# Patient Record
Sex: Male | Born: 1995
Health system: Southern US, Community
[De-identification: ages and names within clinical notes are randomized; demographics above are authoritative.]

---
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
## 2016-01-24 ENCOUNTER — Encounter (HOSPITAL_COMMUNITY): Payer: Self-pay | Admitting: Emergency Medicine

## 2016-01-24 ENCOUNTER — Emergency Department (INDEPENDENT_AMBULATORY_CARE_PROVIDER_SITE_OTHER)
Admission: EM | Admit: 2016-01-24 | Discharge: 2016-01-24 | Disposition: A | Discharge status: Home / Self Care | Payer: 59 | Source: Home / Self Care | Attending: Family Medicine | Admitting: Family Medicine

## 2016-01-24 DIAGNOSIS — J111 Influenza due to unidentified influenza virus with other respiratory manifestations: Secondary | ICD-10-CM

## 2016-01-24 MED ORDER — OSELTAMIVIR PHOSPHATE 75 MG PO CAPS: 75.0000 mg | ORAL_CAPSULE | Freq: Two times a day (BID) | ORAL | Status: AC

## 2016-01-24 NOTE — Discharge Instructions (Signed)
Influenza, Adult Influenza (flu) is an infection in the mouth, nose, and throat (respiratory tract) caused by a virus. The flu can make you feel very ill. Influenza spreads easily from person to person (contagious).  HOME CARE   Only take medicines as told by your doctor.  Use a cool mist humidifier to make breathing easier.  Get plenty of rest until your fever goes away. This usually takes 3 to 4 days.  Drink enough fluids to keep your pee (urine) clear or pale yellow.  Cover your mouth and nose when you cough or sneeze.  Wash your hands well to avoid spreading the flu.  Stay home from work or school until your fever has been gone for at least 1 full day.  Get a flu shot every year. GET HELP RIGHT AWAY IF:   You have trouble breathing or feel short of breath.  Your skin or nails turn blue.  You have severe neck pain or stiffness.  You have a severe headache, facial pain, or earache.  Your fever gets worse or keeps coming back.  You feel sick to your stomach (nauseous), throw up (vomit), or have watery poop (diarrhea).  You have chest pain.  You have a deep cough that gets worse, or you cough up more thick spit (mucus). MAKE SURE YOU:   Understand these instructions.  Will watch your condition.  Will get help right away if you are not doing well or get worse.   This information is not intended to replace advice given to you by your health care provider. Make sure you discuss any questions you have with your health care provider.   Document Released: 08/20/2008 Document Revised: 12/02/2014 Document Reviewed: 02/10/2012 Elsevier Interactive Patient Education 2016 [employer]  

## 2016-01-24 NOTE — ED Notes (Signed)
FLU-LIKE SYMPTOMS, ONSET Monday EVENING

## 2016-01-25 NOTE — ED Provider Notes (Signed)
CSN: [id number]     Arrival date & time 01/24/16  1408 History   First MD Initiated Contact with Patient 01/24/16 1533     Chief Complaint  Patient presents with  . Influenza   (Consider location/radiation/quality/duration/timing/severity/associated sxs/prior Treatment) HPI Pt presents with sore throat, body aches, fever, chills since last night Home treatment has been OTC meds without much relief of symptoms Fever is improved for short periods of time with OTC antipyretics. Pain score is 4 mostly from coughing and body aches Taking fluids, no appetite No flu shot Has been exposed to others with similar symptoms.  Denies: CP, SOB, vomiting or diarrhea.  History reviewed. No pertinent past medical history. History reviewed. No pertinent past surgical history. Family History  Problem Relation Age of Onset  . Hypertension Mother   . Hypertension Father   . Hyperlipidemia Father   . Diabetes Father    Social History  Substance Use Topics  . Smoking status: Never Smoker   . Smokeless tobacco: Never Used  . Alcohol Use: No    Review of Systems See HPI Allergies  Review of patient's allergies indicates no known allergies.  Home Medications   Prior to Admission medications   Medication Sig Start Date End Date Taking? Authorizing Provider  clindamycin (CLEOCIN) 150 MG capsule Take 1 capsule (150 mg total) by mouth 4 (four) times daily. 11/27/15   Linna Hoff, MD  clindamycin (CLEOCIN) 150 MG capsule Take 1 capsule (150 mg total) by mouth 4 (four) times daily. 11/27/15   Linna Hoff, MD  diphenhydrAMINE (BENADRYL) 12.5 MG/5ML elixir Take 12.5 mg by mouth every 4 (four) hours as needed for allergies. For allergic reaction    Historical Provider, MD  ibuprofen (ADVIL,MOTRIN) 600 MG tablet Take 300-600 mg by mouth every 6 (six) hours as needed for pain. For headache 08/22/12   Johnsie Kindred, NP  loratadine (CLARITIN) 10 MG tablet Take 10 mg by mouth daily.    Historical Provider,  MD  oseltamivir (TAMIFLU) 75 MG capsule Take 1 capsule (75 mg total) by mouth every 12 (twelve) hours. 01/24/16   Tharon Aquas, PA   Meds Ordered and Administered this Visit  Medications - No data to display  BP 126/84 mmHg  Pulse 92  Temp(Src) 99.4 F (37.4 C) (Oral)  Resp 18  SpO2 100% No data found.   Physical Exam NURSES NOTES AND VITAL SIGNS REVIEWED. CONSTITUTIONAL: Well developed, well nourished, no acute distress HEENT: normocephalic, atraumatic, right and left TM's are normal EYES: Conjunctiva normal NECK:normal ROM, supple, no adenopathy PULMONARY:No respiratory distress, normal effort, Lungs: CTAb/l, no wheezes, or increased work of breathing CARDIOVASCULAR: RRR, no murmur ABDOMEN: soft, ND, NT, +'ve BS MUSCULOSKELETAL: Normal ROM of all extremities,  SKIN: warm and dry without rash PSYCHIATRIC: Mood and affect, behavior are normal  ED Course  Procedures (including critical care time)  Labs Review Labs Reviewed - No data to display  Imaging Review No results found.   Visual Acuity Review  Right Eye Distance:   Left Eye Distance:   Bilateral Distance:    Right Eye Near:   Left Eye Near:    Bilateral Near:      RX provided for symptoms per request of father   MDM   1. Flu    Patient is reassured that there are no issues that require transfer to higher level of care at this time.  Patient is advised to continue home symptomatic treatment. Prescription is sent to  pharmacy  patient has indicated.  tamiflu Patient is advised that if there are new or worsening symptoms or attend the emergency department, or contact primary care provider. Instructions of care provided discharged home in stable condition. Return to work/school note provided.  THIS NOTE WAS GENERATED USING A VOICE RECOGNITION SOFTWARE PROGRAM. ALL REASONABLE EFFORTS  WERE MADE TO PROOFREAD THIS DOCUMENT FOR ACCURACY.     Tharon Aquas, [place] 01/25/16 [phone]

## 2016-11-01 ENCOUNTER — Encounter (HOSPITAL_COMMUNITY): Payer: Self-pay | Admitting: *Deleted

## 2016-11-01 ENCOUNTER — Ambulatory Visit (HOSPITAL_COMMUNITY)
Admission: EM | Admit: 2016-11-01 | Discharge: 2016-11-01 | Disposition: A | Discharge status: Home / Self Care | Payer: 59 | Attending: Family Medicine | Admitting: Family Medicine

## 2016-11-01 DIAGNOSIS — J029 Acute pharyngitis, unspecified: Secondary | ICD-10-CM | POA: Insufficient documentation

## 2016-11-01 LAB — POCT RAPID STREP A: STREPTOCOCCUS, GROUP A SCREEN (DIRECT): NEGATIVE

## 2016-11-01 MED ORDER — CLINDAMYCIN HCL 150 MG PO CAPS: 150.0000 mg | ORAL_CAPSULE | Freq: Four times a day (QID) | ORAL | 0 refills | Status: AC

## 2016-11-01 MED FILL — CLINDAMYCIN HCL 150 MG CAPS: 150 | 7 days supply | Qty: 28 | Fill #0

## 2016-11-01 NOTE — ED Provider Notes (Signed)
CSN: [id number]     Arrival date & time 11/01/16  1004 History   None    Chief Complaint  Patient presents with  . Sore Throat    The history is provided by the patient.  Sore Throat  This is a recurrent problem. The current episode started more than 2 days ago. The problem occurs constantly. The problem has been gradually worsening. Pertinent negatives include no chest pain, no abdominal pain, no headaches and no shortness of breath. The symptoms are aggravated by swallowing. Nothing relieves the symptoms.  Symptoms associated w/ fevers of 103 (last night). No cough or other upper respiratory symptoms. Admits to increased PND. Symptoms not relieved by salt water gargles or Ibuprofen.   History reviewed. No pertinent past medical history. History reviewed. No pertinent surgical history. Family History  Problem Relation Age of Onset  . Hypertension Mother   . Hypertension Father   . Hyperlipidemia Father   . Diabetes Father    Social History  Substance Use Topics  . Smoking status: Never Smoker  . Smokeless tobacco: Never Used  . Alcohol use No    Review of Systems  Respiratory: Negative for shortness of breath.   Cardiovascular: Negative for chest pain.  Gastrointestinal: Negative for abdominal pain.  Neurological: Negative for headaches.  All other systems reviewed and are negative.   Allergies  Patient has no known allergies.  Home Medications   Prior to Admission medications   Medication Sig Start Date End Date Taking? Authorizing Provider  clindamycin (CLEOCIN) 150 MG capsule Take 1 capsule (150 mg total) by mouth every 6 (six) hours. 11/01/16   Roma KayserKatherine P Kyrstin Campillo, NP  diphenhydrAMINE (BENADRYL) 12.5 MG/5ML elixir Take 12.5 mg by mouth every 4 (four) hours as needed for allergies. For allergic reaction    Historical Provider, MD  ibuprofen (ADVIL,MOTRIN) 600 MG tablet Take 300-600 mg by mouth every 6 (six) hours as needed for pain. For headache 08/22/12   Johnsie Kindredarmen L  Chatten, NP  loratadine (CLARITIN) 10 MG tablet Take 10 mg by mouth daily.    Historical Provider, MD  oseltamivir (TAMIFLU) 75 MG capsule Take 1 capsule (75 mg total) by mouth every 12 (twelve) hours. 01/24/16   Tharon AquasFrank C Patrick, PA   Meds Ordered and Administered this Visit  Medications - No data to display  BP 130/70 (BP Location: Right Arm)   Pulse 80   Temp 98.6 F (37 C) (Oral)   Resp 18   SpO2 100%  No data found.   Physical Exam  Constitutional: He is oriented to person, place, and time. He appears well-nourished.  HENT:  Head: Normocephalic and atraumatic.  Right Ear: Tympanic membrane normal.  Left Ear: Tympanic membrane normal.  Nose: Nose normal.  Mouth/Throat: Uvula is midline and mucous membranes are normal. Posterior oropharyngeal edema and posterior oropharyngeal erythema present. No oropharyngeal exudate or tonsillar abscesses. Tonsils are 2+ on the right. Tonsils are 2+ on the left. No tonsillar exudate.  Eyes: Conjunctivae and lids are normal.  Cardiovascular: Normal rate.   Pulmonary/Chest: Effort normal.  Neurological: He is alert and oriented to person, place, and time.  Skin: Skin is warm and dry.    Urgent Care Course   Clinical Course     Procedures (including critical care time)  Labs Review Labs Reviewed  POCT RAPID STREP A    Imaging Review No results found.   Visual Acuity Review  Right Eye Distance:   Left Eye Distance:   Bilateral Distance:  Right Eye Near:   Left Eye Near:    Bilateral Near:         MDM   1. 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  ? Allergies Clindamycin 150 mg QID x 7 days Claritin and Ibuprofen as directed.  Home care discussed and provided in print.    Leanne ChangKatherine P Shanisha Lech, NP 11/01/16 1137

## 2016-11-01 NOTE — Discharge Instructions (Signed)
Your strep test today is negative. We are treating your for acute pharyngitis. Take antibiotics until completed. You may also benefit from over the counter Claritin (Loratadine) 10 mg daily and Ibuprofen 400-600 mg 3 to 4 times daily as needed for fever, aches and pain.

## 2016-11-01 NOTE — ED Triage Notes (Signed)
Pt   Reports   Symptoms   Of     sorethroat     -       And     Pain   When  He   Swallows

## 2016-11-02 LAB — CULTURE, GROUP A STREP (THRC)

## 2020-03-16 ENCOUNTER — Ambulatory Visit: Payer: 59 | Attending: Family

## 2020-03-16 DIAGNOSIS — Z23 Encounter for immunization: Secondary | ICD-10-CM

## 2020-03-16 NOTE — Progress Notes (Signed)
   Covid-19 Vaccination Clinic  Name:  Dustin Preston    MRN: [id number] DOB: Jun 28, 1996  03/16/2020  [name] was observed post Covid-19 immunization for 15 minutes without incident. He was provided with Vaccine Information Sheet and instruction to access the V-Safe system.   [name] was instructed to call 911 with any severe reactions post vaccine: Marland Kitchen Difficulty breathing  . Swelling of face and throat  . A fast heartbeat  . A bad rash all over body  . Dizziness and weakness   Immunizations Administered    Name Date Dose VIS Date Route   Moderna COVID-19 Vaccine 03/16/2020  1:19 PM 0.5 mL 10/2019 Intramuscular   Manufacturer: Moderna   Lot: [id number]   NDC: 24268-341-96

## 2020-04-11 ENCOUNTER — Ambulatory Visit: Payer: 59 | Attending: Family

## 2020-04-11 ENCOUNTER — Ambulatory Visit: Payer: 59

## 2020-04-11 DIAGNOSIS — Z23 Encounter for immunization: Secondary | ICD-10-CM

## 2020-04-11 NOTE — Progress Notes (Signed)
   Covid-19 Vaccination Clinic  Name:  Dustin Preston    MRN: [id number] DOB: Sep 02, 1996  04/11/2020  [name] was observed post Covid-19 immunization for 15 minutes without incident. He was provided with Vaccine Information Sheet and instruction to access the V-Safe system.   [name] was instructed to call 911 with any severe reactions post vaccine: Marland Kitchen Difficulty breathing  . Swelling of face and throat  . A fast heartbeat  . A bad rash all over body  . Dizziness and weakness   Immunizations Administered    Name Date Dose VIS Date Route   Moderna COVID-19 Vaccine 04/11/2020  3:55 PM 0.5 mL 10/2019 Intramuscular   Manufacturer: Moderna   Lot: [id number]   NDC: 81661-969-40
